# Patient Record
Sex: Female | Born: 1950 | Race: Black or African American | Hispanic: No | Marital: Single
Health system: Southern US, Community
[De-identification: ages and names within clinical notes are randomized; demographics above are authoritative.]

---

## 2004-11-29 ENCOUNTER — Ambulatory Visit (HOSPITAL_COMMUNITY): Admission: RE | Admit: 2004-11-29 | Discharge: 2004-11-29 | Payer: Self-pay | Admitting: Gastroenterology

## 2005-01-31 ENCOUNTER — Ambulatory Visit (HOSPITAL_COMMUNITY): Admission: RE | Admit: 2005-01-31 | Discharge: 2005-01-31 | Payer: Self-pay | Admitting: Internal Medicine

## 2006-03-01 ENCOUNTER — Ambulatory Visit (HOSPITAL_COMMUNITY): Admission: RE | Admit: 2006-03-01 | Discharge: 2006-03-01 | Payer: Self-pay | Admitting: Internal Medicine

## 2007-03-04 ENCOUNTER — Ambulatory Visit (HOSPITAL_COMMUNITY): Admission: RE | Admit: 2007-03-04 | Discharge: 2007-03-04 | Payer: Self-pay | Admitting: Internal Medicine

## 2007-03-05 ENCOUNTER — Observation Stay (HOSPITAL_COMMUNITY): Admission: RE | Admit: 2007-03-05 | Discharge: 2007-03-06 | Payer: Self-pay | Admitting: Otolaryngology

## 2007-03-05 ENCOUNTER — Encounter (INDEPENDENT_AMBULATORY_CARE_PROVIDER_SITE_OTHER): Payer: Self-pay | Admitting: Otolaryngology

## 2007-11-26 ENCOUNTER — Encounter: Admission: RE | Admit: 2007-11-26 | Discharge: 2007-11-26 | Payer: Self-pay | Admitting: Otolaryngology

## 2008-03-04 ENCOUNTER — Ambulatory Visit (HOSPITAL_BASED_OUTPATIENT_CLINIC_OR_DEPARTMENT_OTHER): Admission: RE | Admit: 2008-03-04 | Discharge: 2008-03-04 | Payer: Self-pay | Admitting: Internal Medicine

## 2009-01-15 IMAGING — CR DG CHEST 2V
2 series · 2 of 2 positions shown · non-contrast
Comparison: none

CLINICAL DATA: Deviated nasal septum.  Snoring and sleep apnea.  Preop respiratory exam.  
 CHEST - 2 VIEW:

[view not recorded (1 of 2)]
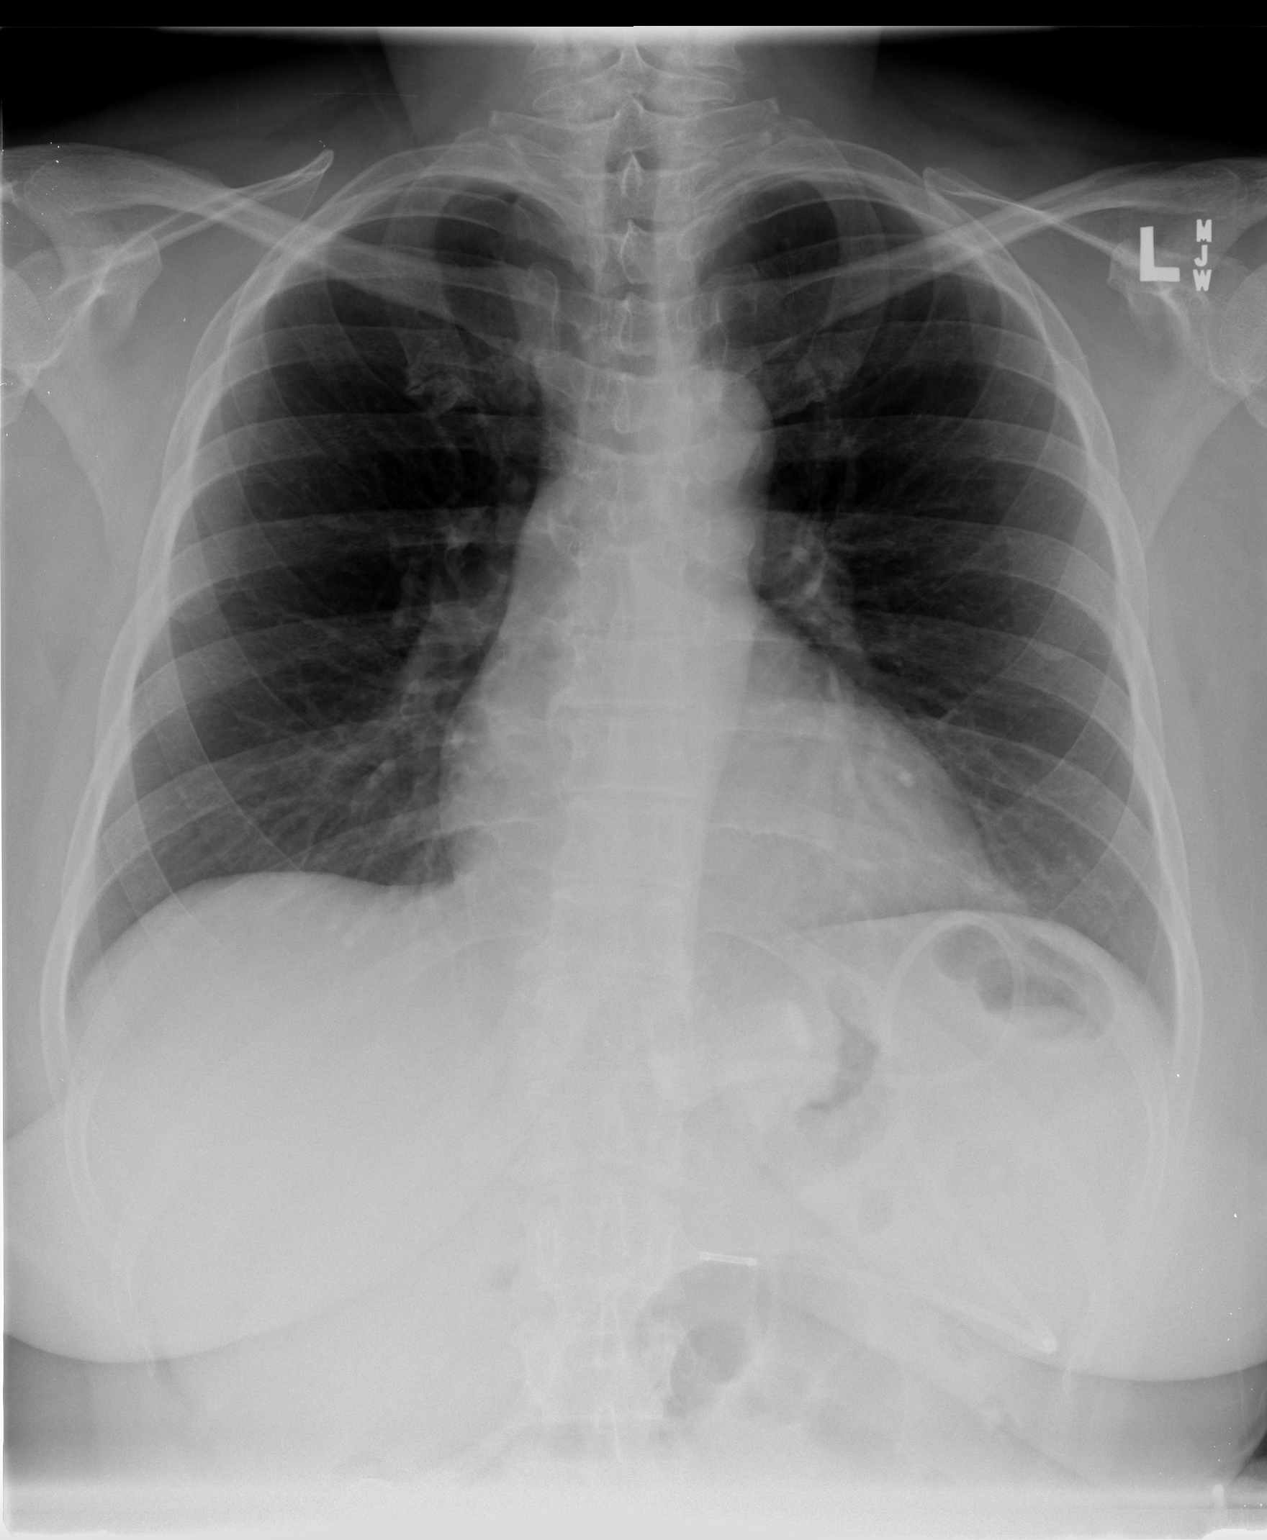

[view not recorded (2 of 2)]
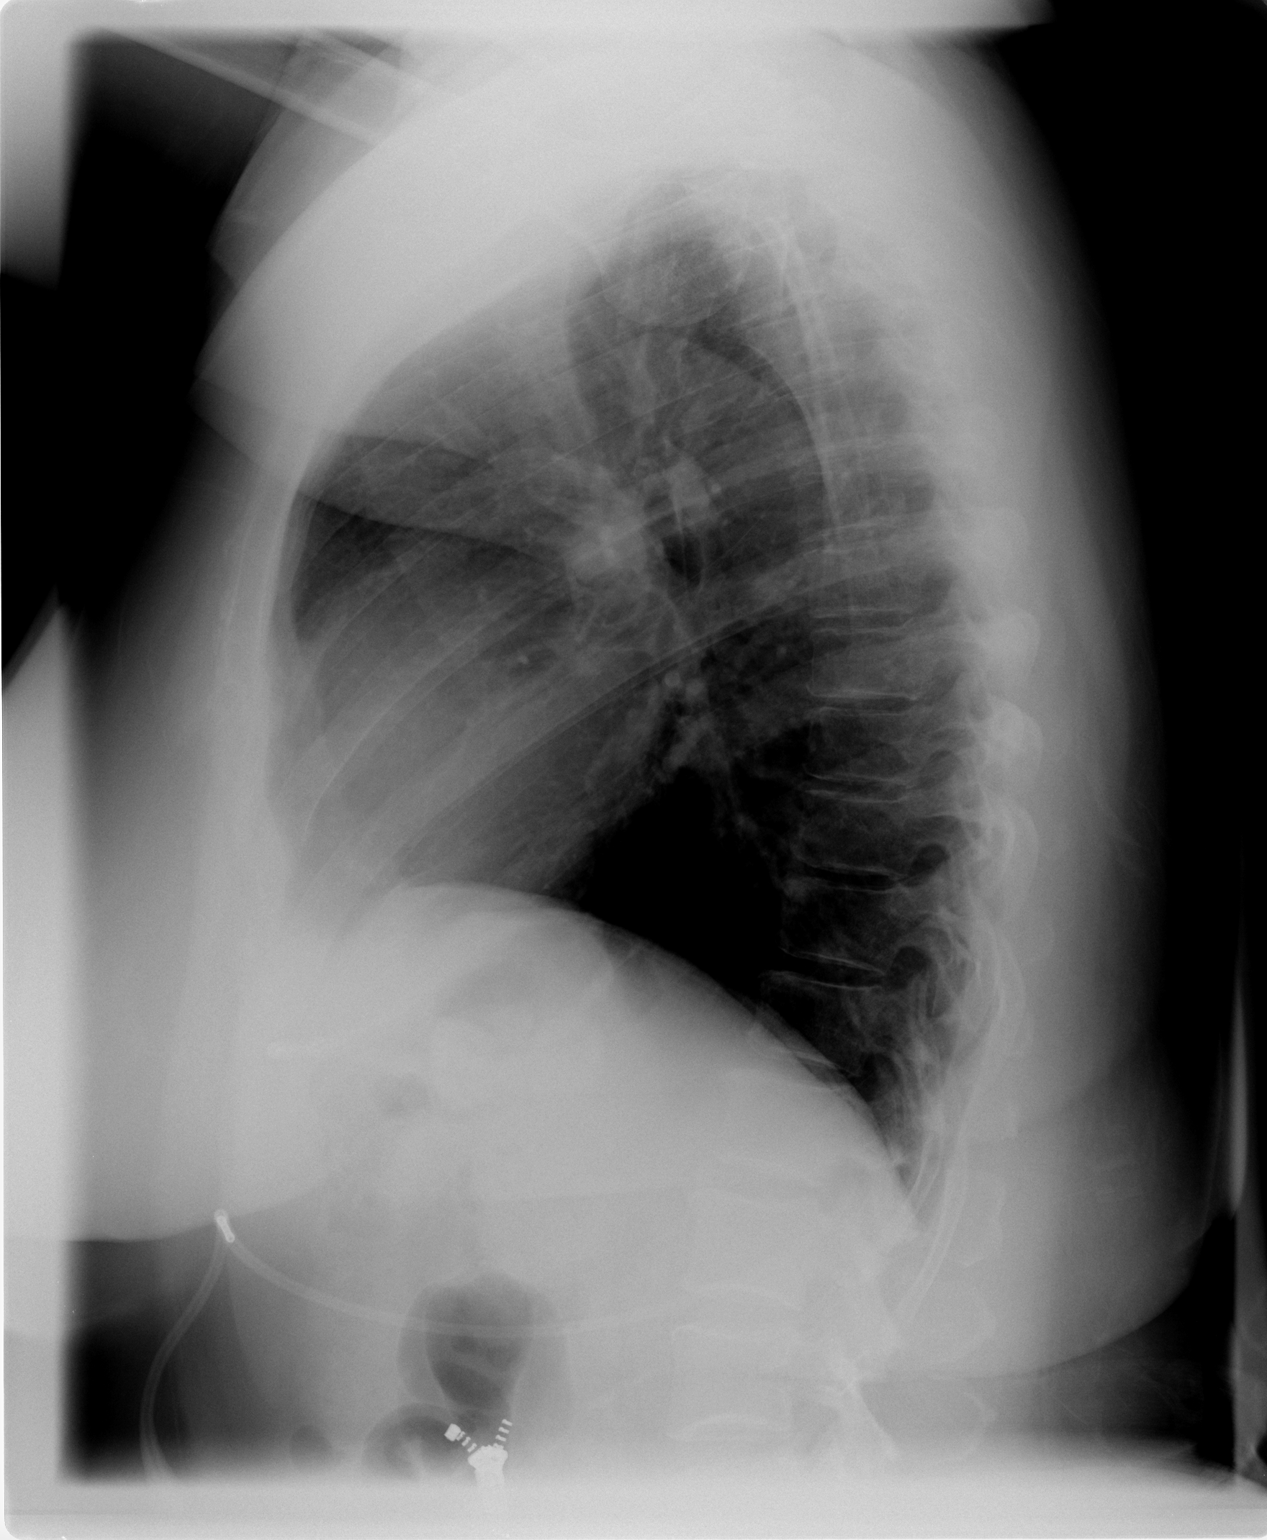

[2 of 2 positions shown; findings below may reference images not displayed]

FINDINGS: The heart size and mediastinal contours are within normal limits.  Both lungs are clear.  The visualized skeletal structures are unremarkable.
IMPRESSION: No active cardiopulmonary disease.

## 2009-03-18 ENCOUNTER — Ambulatory Visit (HOSPITAL_BASED_OUTPATIENT_CLINIC_OR_DEPARTMENT_OTHER): Admission: RE | Admit: 2009-03-18 | Discharge: 2009-03-18 | Payer: Self-pay | Admitting: Internal Medicine

## 2009-03-18 ENCOUNTER — Ambulatory Visit: Payer: Self-pay | Admitting: Diagnostic Radiology

## 2010-03-23 ENCOUNTER — Ambulatory Visit (HOSPITAL_BASED_OUTPATIENT_CLINIC_OR_DEPARTMENT_OTHER)
Admission: RE | Admit: 2010-03-23 | Discharge: 2010-03-23 | Payer: Self-pay | Source: Home / Self Care | Admitting: Internal Medicine

## 2010-08-30 NOTE — H&P (Signed)
NAME:  [name] [name], [name] [name]                  ACCOUNT NO.:  [id number]   MEDICAL RECORD NO.:  [id number]          PATIENT TYPE:  OUT   LOCATION:  [facility]                          FACILITY:  [facility]   PHYSICIAN:  Hermelinda Medicus, M.D.   DATE OF BIRTH:  04-14-1951   DATE OF ADMISSION:  03/04/2007  DATE OF DISCHARGE:                              HISTORY & PHYSICAL   This patient is a 60 year old female who weighs 250 pounds and is 63  inches tall.  She has been on CPAP.  She has a BMI of 44.3, and she also  has an RDI on sleep study of 16.5, lowest O2 is 74%.  She has been on  CPAP but it has failed.  She has gone from 12 to 14 pressure, and this  is because she has quite a severe septal deviation and turbinate  hypertrophy.  She also has a very low uvula and palate and now enters  for a septal reconstruction, turbinate reduction, and also a  uvulopalatoplasty in an effort to gain some space in her oral cavity and  elevate the contour of her palate and remove a very large uvula and also  gain some nasal airway.   ALLERGIES:  PENICILLIN, RASH.   PAST MEDICAL HISTORY:  1. Bilateral knee surgery, menisci removed.  2. Gastric lap band.  3. Maxillary mandibular reduction.  4. Breast reduction.  5. Now her diagnosis of septal deviation, turbinate hypertrophy with      concha bullosa, with redundant uvula and palate.   PHYSICAL EXAMINATION:  VITAL SIGNS:  Blood pressure 128/86, pulse 77,  SAO2 100.  HEENT:  The ears are clear.  Tympanic membrane move well.  She has a low  uvula and palate and a very large uvula.  She also has a turbinate  hypertrophy with concha bullosa with a septal deviation.  CHEST:  Clear.  No rales, rhonchi, or wheezes.  CARDIOVASCULAR:  No __________  murmurs or gallops.  ABDOMEN:  Moderately obese.   Our plan is to get a septal reconstruction and turbinate reduction and a  reduction a concha bullosa with a uvulopalatoplasty under general  endotracheal anesthesia.   FINAL  DIAGNOSES:  1. 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  2. History of bilateral knee menisci surgery.  3. History gastric lap band.  4. History of bilateral maxillary mandibular reduction.  5. History of breast reduction.           ______________________________  Hermelinda Medicus, M.D.     JC/MEDQ  D:  03/05/2007  T:  03/05/2007  Job:  [id number]   cc:   Candyce Churn. Allyne Gee, M.D.

## 2010-08-30 NOTE — Op Note (Signed)
NAME:  [name] [name]                  ACCOUNT NO.:  [id number]   MEDICAL RECORD NO.:  [id number]          PATIENT TYPE:  OBV   LOCATION:  3315                         FACILITY:  [facility]   PHYSICIAN:  Hermelinda Medicus, M.D.   DATE OF BIRTH:  1950-09-20   DATE OF PROCEDURE:  DATE OF DISCHARGE:                               OPERATIVE REPORT   PREOPERATIVE DIAGNOSES:  History of sleep apnea with failed CPAP with  septal deviation, turbinate hypertrophy, concha bullosa, redundant uvula  and palate.   POSTOPERATIVE DIAGNOSES:  History of sleep apnea with failed CPAP with  septal deviation, turbinate hypertrophy, concha bullosa, redundant uvula  and palate.   OPERATION:  Septal reconstruction, turbinate reduction, reduction of  concha bullosa with a uvulopalatoplasty under general endotracheal  anesthesia.   SURGEON:  Dr. Hermelinda Medicus.   ANESTHESIA:  General endotracheal with Dr. Randa Evens. The patient is aware  of the risks and gains that she is going to have nasal congestion, that  she will have airway congestion and a crusting and that she will be in  the office to have this cleansed. Her oral cavity is also going to be  quite sore. She is going to avoid anything containing vinegar or highly  flavored foods, pizzas or tacos or hamburger or anything acidic. Also  she is not to travel a long distance for about 10 days.   DESCRIPTION OF PROCEDURE:  The patient placed in the supine position and  under general endotracheal anesthesia, the nose was first approached  after prepping and draping. Cocaine 200 mg, 1% Xylocaine 7 mL was used  and the septum was first approached after reducing the inferior  turbinates but not removing the mucous membrane we outfractured these  turbinates. Also a very large concha bullosa was seen on the right side  which was corrected by again crushing the concha bullosa but not  removing mucous membrane. The septum was then approached using a  hemitransfixion  incision, carried back along the posterior quadrilateral  cartilage and ethmoid septum. The open and close Jansen-Middleton's were  used to remove the septal ethmoid bone as well as the vomerine bony  structure and then the remainder of the septum was able to be pushed  back in the midline and held in place. Once this was completed, closure  was begun using 4-0 plain catgut through-and-through septal suture as a  bunching stitch, maintaining any hemostasis and also minimizing swelling  using the 4-0 plain x2.  Once this was completed then we could get  excellent airway by reducing the turbinates and we further used the  __________ at 12 to minimize the mucous membrane edema cauterizing the  inferior lateral aspect of these turbinates. Once this was completed,  then the attention was then carried to the oral cavity where the oral  cavity was approached placing the tonsillar gag and the uvula was  approximately three times its normal size. We made incisions lateral to  this using the electrocoagulation for hemostasis and we also trimmed the  uvula down to about its more normal size. We closed this  mucous membrane  anterior-posterior with 5-0 plain catgut and all hemostasis being  established. The stomach was suctioned, the nasopharynx was suctioned,  Telfa was placed within the nose temporarily and this was removed and  then anesthesia trumpets were placed and these will be placed for her  airway postoperatively as she is awakening.  She will be placed in the step-down just for observation with pulse  oximeter control and then our plan would be hopefully to be able to  abandon the CPAP in the future.  She will follow up in 3 days and then  in 10 days and then 3 weeks, 6 weeks, 3 months, 6 months and a year.           ______________________________  Hermelinda Medicus, M.D.     JC/MEDQ  D:  03/05/2007  T:  03/05/2007  Job:  [id number]

## 2011-01-24 LAB — POCT I-STAT 4, (NA,K, GLUC, HGB,HCT)
Glucose, Bld: 99
HCT: 45
Hemoglobin: 15.3 — ABNORMAL HIGH
Operator id: 151361
Potassium: 4.6
Sodium: 138

## 2011-01-24 LAB — CBC
HCT: 36.3
Hemoglobin: 12.2
MCHC: 33.6
MCV: 86.8
Platelets: 299
RBC: 4.18
RDW: 14.5
WBC: 6

## 2011-01-24 LAB — URINALYSIS, ROUTINE W REFLEX MICROSCOPIC
Bilirubin Urine: NEGATIVE
Glucose, UA: NEGATIVE
Hgb urine dipstick: NEGATIVE
Ketones, ur: NEGATIVE
Nitrite: NEGATIVE
Protein, ur: NEGATIVE
Specific Gravity, Urine: 1.02
Urobilinogen, UA: 0.2
pH: 7

## 2011-01-24 LAB — I-STAT 8, (EC8 V) (CONVERTED LAB)
Acid-base deficit: 1
BUN: 13
Bicarbonate: 22.3
Chloride: 112
Glucose, Bld: 97
HCT: 45
Hemoglobin: 15.3 — ABNORMAL HIGH
Operator id: 151361
Potassium: 6.7
Sodium: 141
TCO2: 23
pCO2, Ven: 34.1 — ABNORMAL LOW
pH, Ven: 7.423 — ABNORMAL HIGH

## 2011-01-24 LAB — BASIC METABOLIC PANEL
BUN: 8
CO2: 28
Calcium: 9.7
Chloride: 103
Creatinine, Ser: 0.68
GFR calc Af Amer: >60
GFR calc non Af Amer: >60
Glucose, Bld: 88
Potassium: 3.7
Sodium: 138

## 2011-03-16 ENCOUNTER — Other Ambulatory Visit (HOSPITAL_BASED_OUTPATIENT_CLINIC_OR_DEPARTMENT_OTHER): Payer: Self-pay | Admitting: Internal Medicine

## 2011-03-16 DIAGNOSIS — Z1231 Encounter for screening mammogram for malignant neoplasm of breast: Secondary | ICD-10-CM

## 2011-03-29 ENCOUNTER — Ambulatory Visit (HOSPITAL_BASED_OUTPATIENT_CLINIC_OR_DEPARTMENT_OTHER)
Admission: RE | Admit: 2011-03-29 | Discharge: 2011-03-29 | Disposition: A | Discharge status: Home / Self Care | Payer: Federal, State, Local not specified - PPO | Source: Ambulatory Visit | Attending: Internal Medicine | Admitting: Internal Medicine

## 2011-03-29 DIAGNOSIS — Z1231 Encounter for screening mammogram for malignant neoplasm of breast: Secondary | ICD-10-CM

## 2012-03-04 ENCOUNTER — Other Ambulatory Visit (HOSPITAL_BASED_OUTPATIENT_CLINIC_OR_DEPARTMENT_OTHER): Payer: Self-pay | Admitting: Internal Medicine

## 2012-03-04 DIAGNOSIS — Z1231 Encounter for screening mammogram for malignant neoplasm of breast: Secondary | ICD-10-CM

## 2012-04-02 ENCOUNTER — Ambulatory Visit (HOSPITAL_BASED_OUTPATIENT_CLINIC_OR_DEPARTMENT_OTHER)
Admission: RE | Admit: 2012-04-02 | Discharge: 2012-04-02 | Disposition: A | Discharge status: Home / Self Care | Payer: Federal, State, Local not specified - PPO | Source: Ambulatory Visit | Attending: Internal Medicine | Admitting: Internal Medicine

## 2012-04-02 DIAGNOSIS — Z1231 Encounter for screening mammogram for malignant neoplasm of breast: Secondary | ICD-10-CM | POA: Insufficient documentation

## 2012-04-02 DIAGNOSIS — N6459 Other signs and symptoms in breast: Secondary | ICD-10-CM | POA: Insufficient documentation

## 2012-04-16 ENCOUNTER — Other Ambulatory Visit: Payer: Self-pay | Admitting: Internal Medicine

## 2012-04-16 DIAGNOSIS — R928 Other abnormal and inconclusive findings on diagnostic imaging of breast: Secondary | ICD-10-CM

## 2012-05-01 ENCOUNTER — Ambulatory Visit
Admission: RE | Admit: 2012-05-01 | Discharge: 2012-05-01 | Disposition: A | Discharge status: Home / Self Care | Payer: Federal, State, Local not specified - PPO | Source: Ambulatory Visit | Attending: Internal Medicine | Admitting: Internal Medicine

## 2012-05-01 ENCOUNTER — Other Ambulatory Visit: Payer: Self-pay | Admitting: Internal Medicine

## 2012-05-01 DIAGNOSIS — R928 Other abnormal and inconclusive findings on diagnostic imaging of breast: Secondary | ICD-10-CM

## 2012-05-13 ENCOUNTER — Ambulatory Visit
Admission: RE | Admit: 2012-05-13 | Discharge: 2012-05-13 | Disposition: A | Discharge status: Home / Self Care | Payer: Federal, State, Local not specified - PPO | Source: Ambulatory Visit | Attending: Internal Medicine | Admitting: Internal Medicine

## 2012-05-13 ENCOUNTER — Other Ambulatory Visit: Payer: Self-pay | Admitting: Internal Medicine

## 2012-05-13 DIAGNOSIS — R928 Other abnormal and inconclusive findings on diagnostic imaging of breast: Secondary | ICD-10-CM

## 2013-03-03 ENCOUNTER — Other Ambulatory Visit (HOSPITAL_BASED_OUTPATIENT_CLINIC_OR_DEPARTMENT_OTHER): Payer: Self-pay | Admitting: Internal Medicine

## 2013-03-03 DIAGNOSIS — Z1231 Encounter for screening mammogram for malignant neoplasm of breast: Secondary | ICD-10-CM

## 2013-04-02 ENCOUNTER — Ambulatory Visit (HOSPITAL_BASED_OUTPATIENT_CLINIC_OR_DEPARTMENT_OTHER)
Admission: RE | Admit: 2013-04-02 | Discharge: 2013-04-02 | Disposition: A | Discharge status: Home / Self Care | Payer: Federal, State, Local not specified - PPO | Source: Ambulatory Visit | Attending: Internal Medicine | Admitting: Internal Medicine

## 2013-04-02 DIAGNOSIS — Z1231 Encounter for screening mammogram for malignant neoplasm of breast: Secondary | ICD-10-CM | POA: Insufficient documentation

## 2013-04-16 ENCOUNTER — Other Ambulatory Visit: Payer: Self-pay | Admitting: Internal Medicine

## 2013-04-16 DIAGNOSIS — R109 Unspecified abdominal pain: Secondary | ICD-10-CM

## 2013-04-16 DIAGNOSIS — R102 Pelvic and perineal pain: Secondary | ICD-10-CM

## 2013-04-23 ENCOUNTER — Ambulatory Visit
Admission: RE | Admit: 2013-04-23 | Discharge: 2013-04-23 | Disposition: A | Discharge status: Home / Self Care | Payer: Federal, State, Local not specified - PPO | Source: Ambulatory Visit | Attending: Internal Medicine | Admitting: Internal Medicine

## 2013-04-23 DIAGNOSIS — R102 Pelvic and perineal pain: Secondary | ICD-10-CM

## 2013-04-23 DIAGNOSIS — R109 Unspecified abdominal pain: Secondary | ICD-10-CM

## 2013-04-23 MED ORDER — IOHEXOL 300 MG/ML  SOLN
125.0000 mL | Freq: Once | INTRAMUSCULAR | Status: AC | PRN
  Administered 2013-04-23: 125 mL via INTRAVENOUS

## 2014-03-18 IMAGING — US US BREAST*R*
1 series · 6 of 6 positions shown · non-contrast
Comparison: 03/29/2011, 03/23/2010, 03/18/2009, 03/04/2008 and
03/04/2007

CLINICAL DATA: Patient presents for additional views of the right
breast as follow-up to a recent screening exam suggesting a
possible mass of the deep third of the lower right breast. History
of prior reduction mammoplasty 0999.  History of MGUS.

DIGITAL DIAGNOSTIC RIGHT MAMMOGRAM  AND RIGHT BREAST ULTRASOUND:

[Series 1: us breast*right* · 6 of 6 slices shown]
[im 1/6]
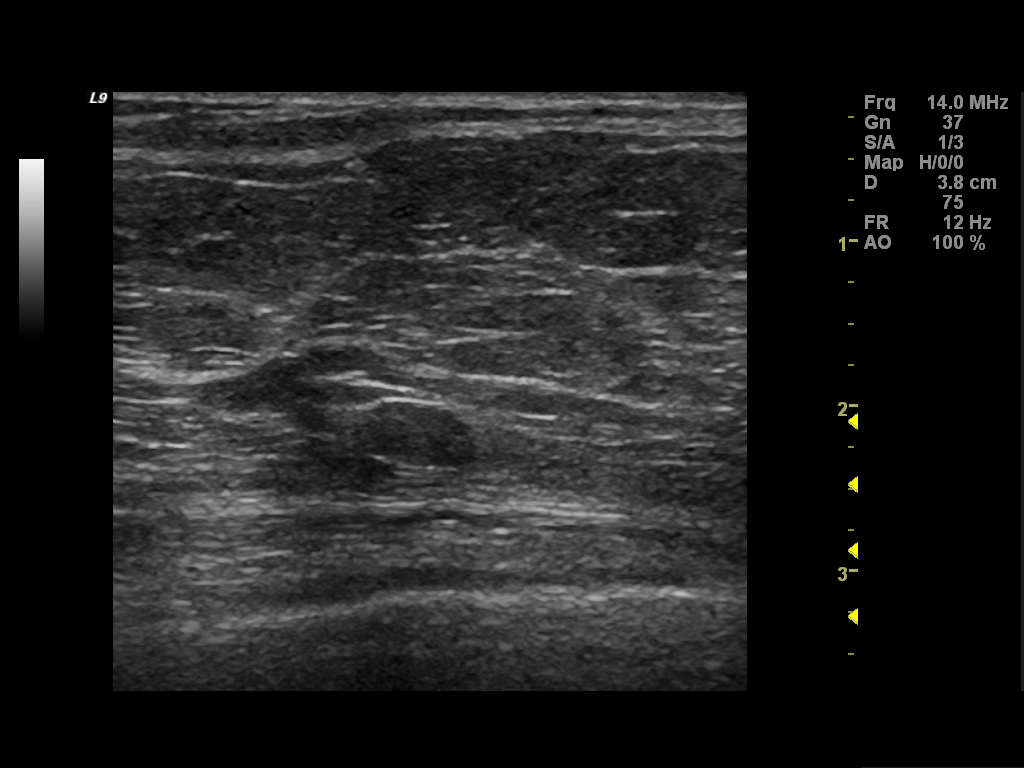
[im 2/6]
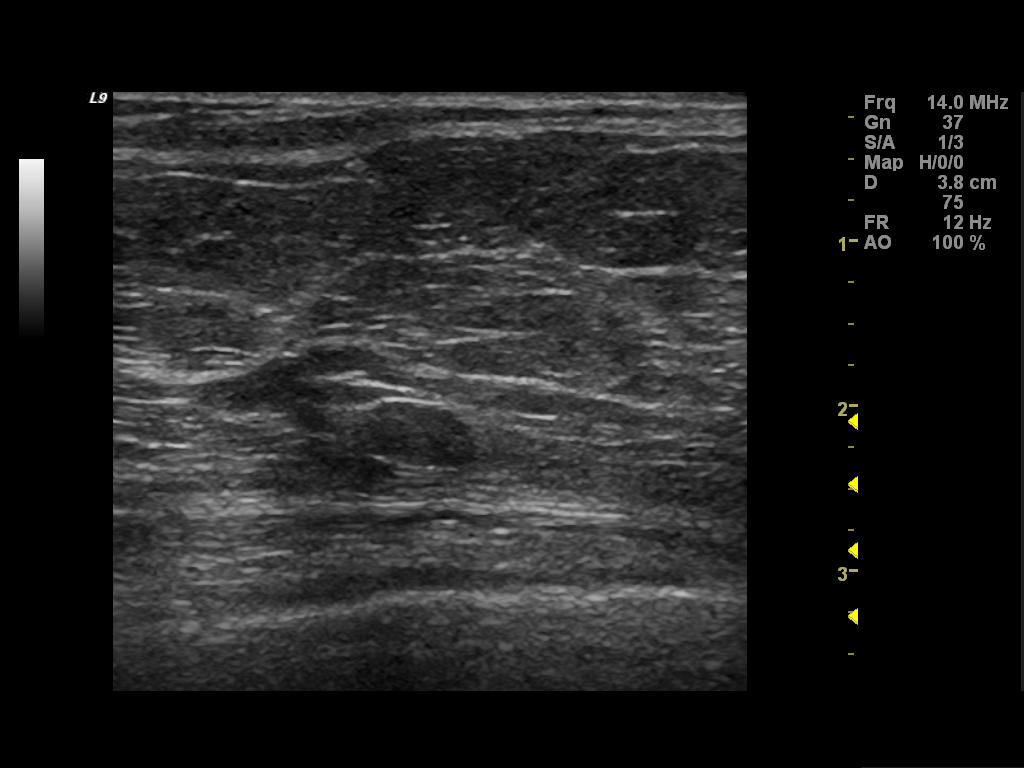
[im 3/6]
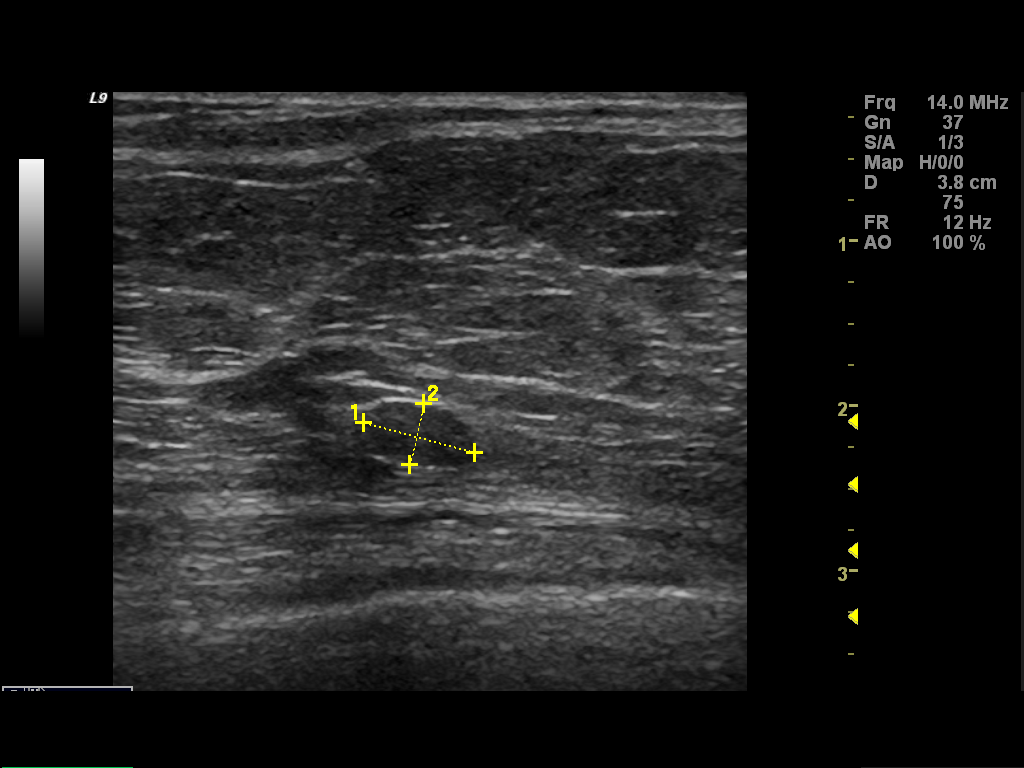
[im 4/6]
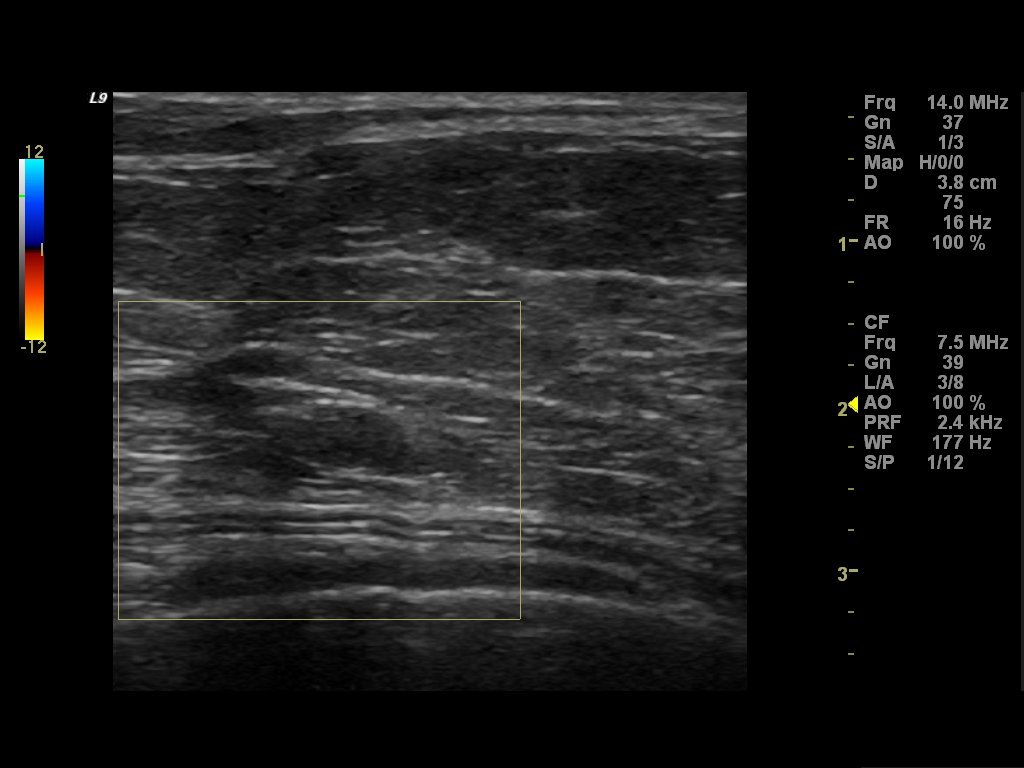
[im 5/6]
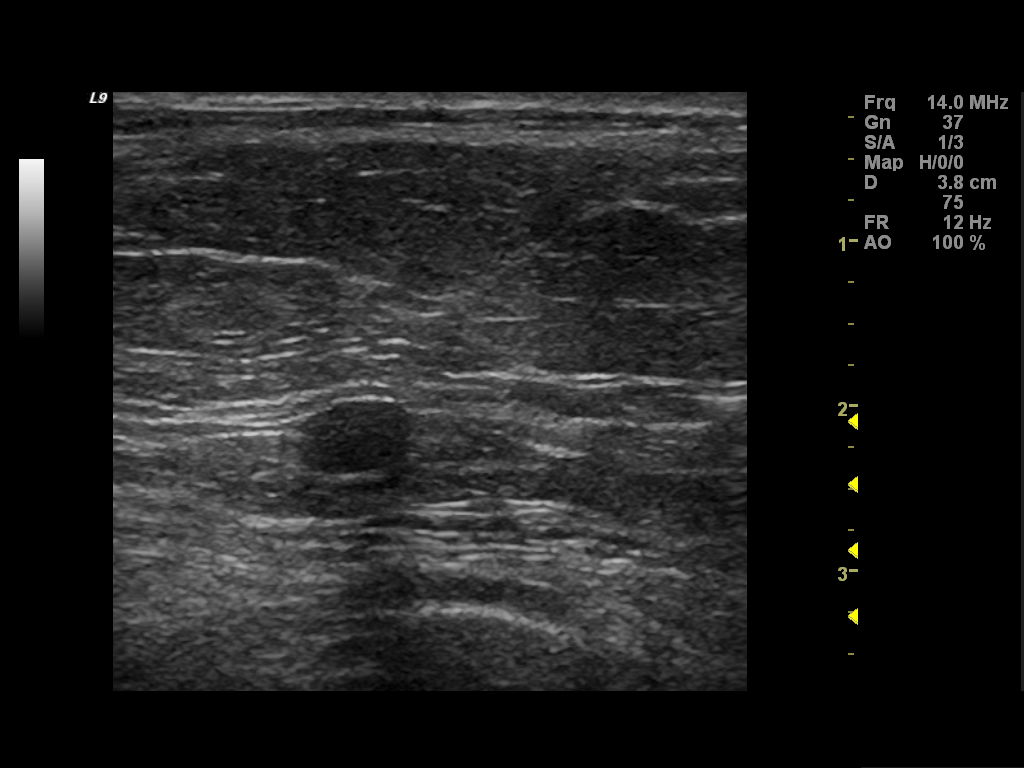
[im 6/6]
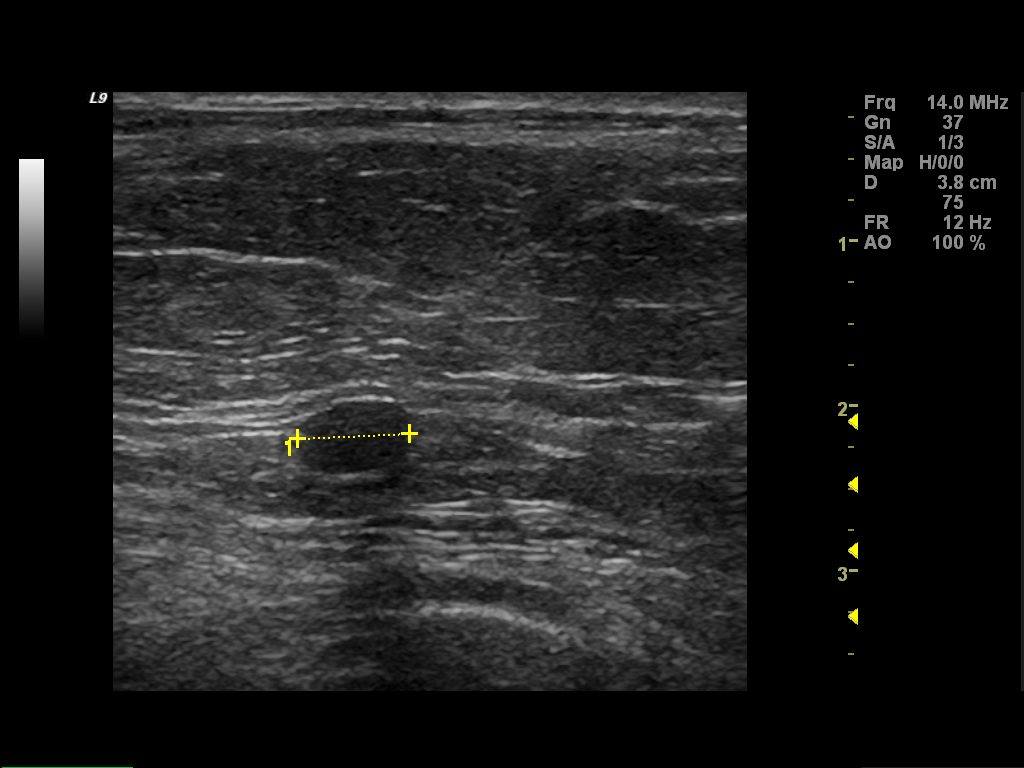

[6 of 6 positions shown; findings below may reference images not displayed]

FINDINGS: ACR Breast Density Category 1: The breast tissue is almost entirely
fatty.

Additional exaggerated CC, spot compression MLO and true lateral
images were obtained.  Again noted is a rounded circumscribed mass
of the deep third of the lower slightly outer right breast.

Ultrasound is performed, showing a well defined ovoid homogeneously
hypoechoic mass at the 7 o'clock position of the right breast 6 cm
from the nipple.  This measures 4 x 7 x 7 mm as features suggest a
benign process.
IMPRESSION: Probable benign well defined ovoid homogeneously hypoechoic mass at
the 7 o'clock position of the right breast 6 cm from the nipple
measuring 4 x 7 x 7 mm.

RECOMMENDATION:
Management options of 6-month follow-up imaging surveillance versus
ultrasound-guided core needle biopsy were discussed as patient
wishes to proceed with ultrasound guided core needle biopsy.

I have discussed the findings and recommendations with the patient.
Results were also provided in writing at the conclusion of the
visit.

BI-RADS CATEGORY 4:  Suspicious abnormality - biopsy should be
considered.

Biopsy to be scheduled prior to patient's departure from the [REDACTED].  Biopsy scheduled for 05/13/2012 at [DATE] a.m..

## 2015-03-10 IMAGING — CT CT ABD-PELV W/ CM
1 of 3 series · 14 of 32 positions shown, 19 images · IV contrast (READICAT/WATER & [ID] OMNI 350)
Comparison: None.

CLINICAL DATA: Left pelvic pain, prior lap band (removed November 2012)

EXAM:
CT ABDOMEN AND PELVIS WITH CONTRAST
TECHNIQUE: Multidetector CT imaging of the abdomen and pelvis was performed
using the standard protocol following bolus administration of
intravenous contrast.
CONTRAST:  125mL OMNIPAQUE IOHEXOL 300 MG/ML  SOLN

[Series 3: abd/pelvis with · axial · 0.98mm/px · z∈[-449,-29]mm · 14 of 94 slices shown, 19 images]
[im 5/94  soft-tissue]
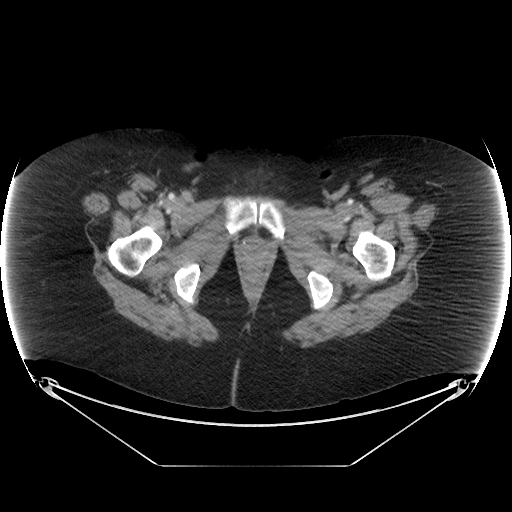
[im 5/94  bone]
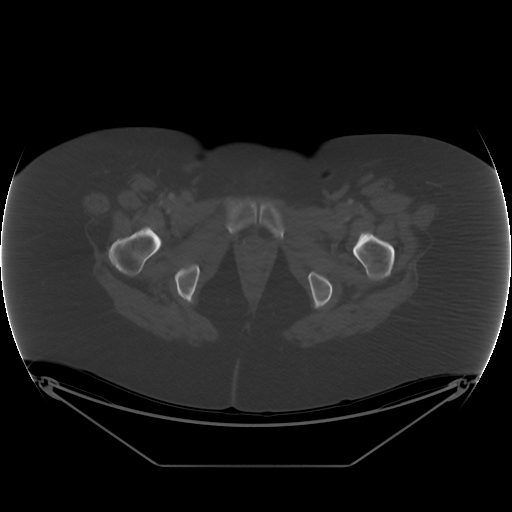
[im 14/94  soft-tissue]
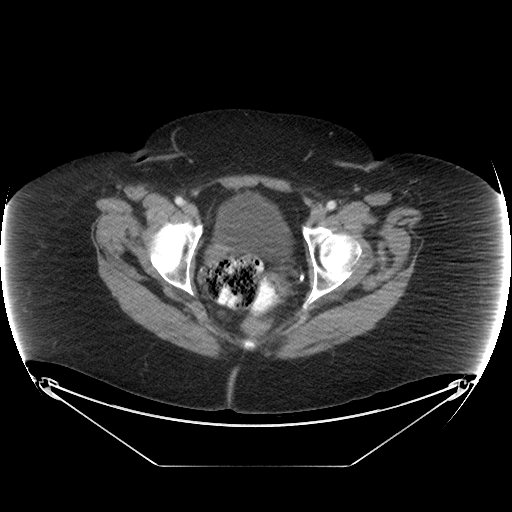
[im 19/94  soft-tissue]
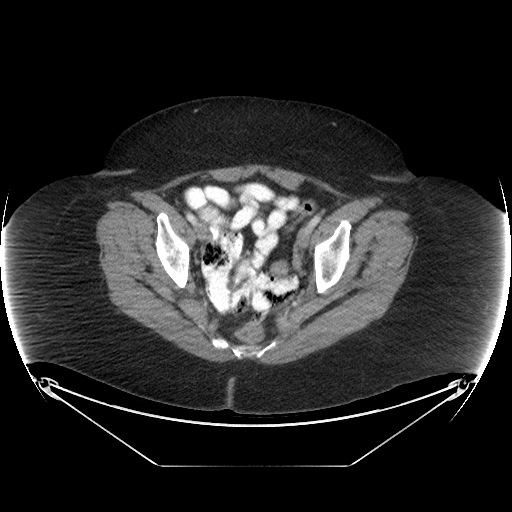
[im 28/94  soft-tissue]
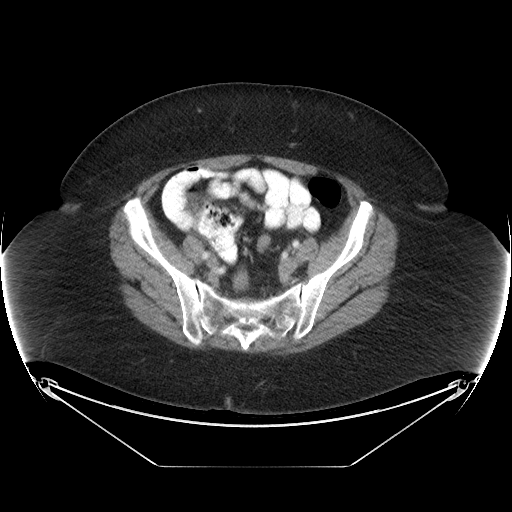
[im 33/94  soft-tissue]
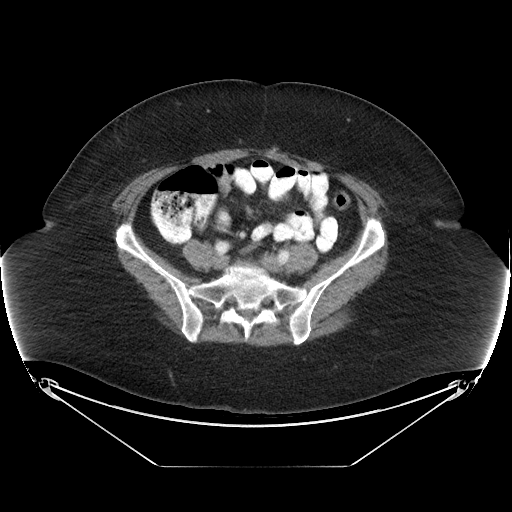
[im 42/94  soft-tissue]
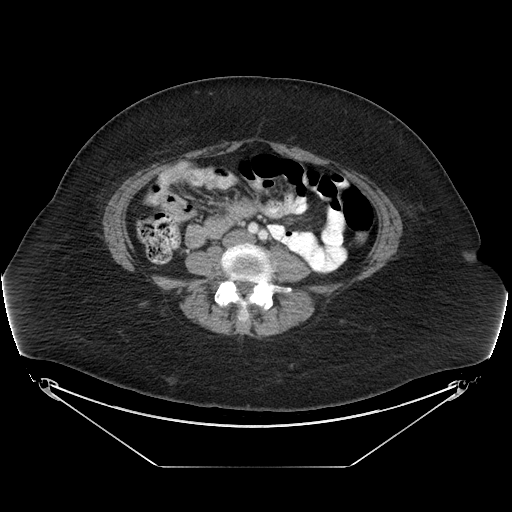
[im 47/94  soft-tissue]
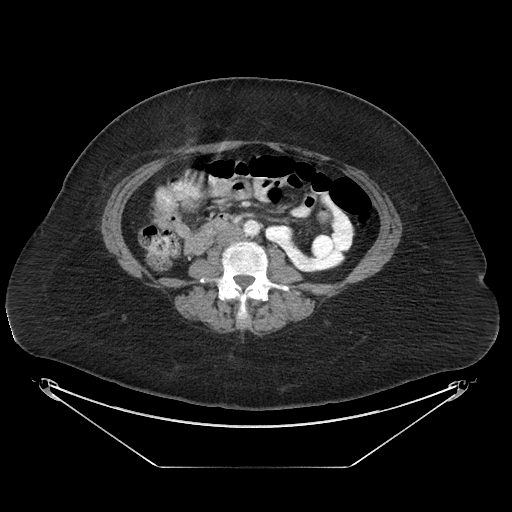
[im 52/94  soft-tissue]
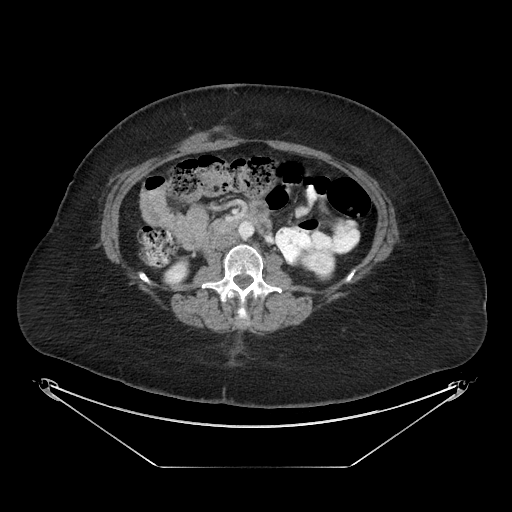
[im 61/94  soft-tissue]
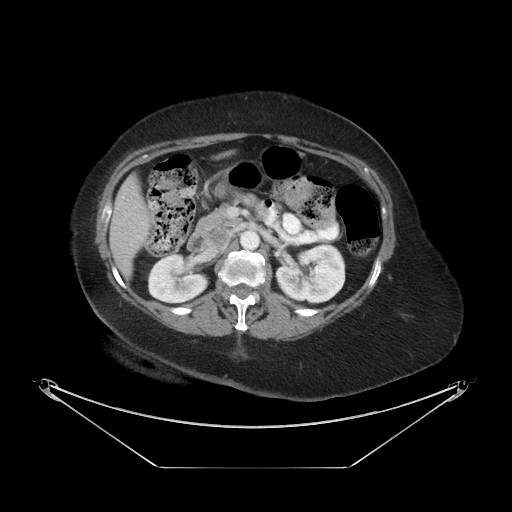
[im 61/94  bone]
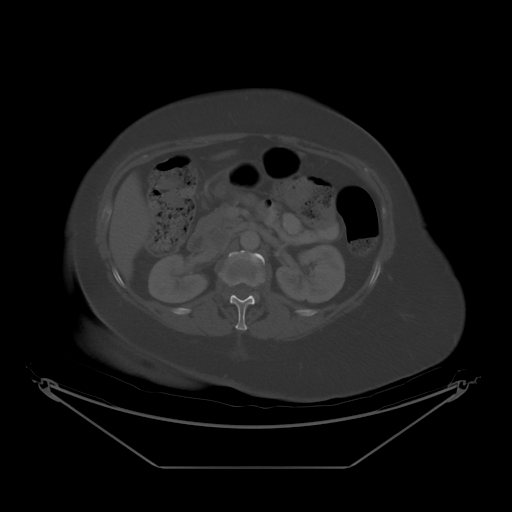
[im 66/94  soft-tissue]
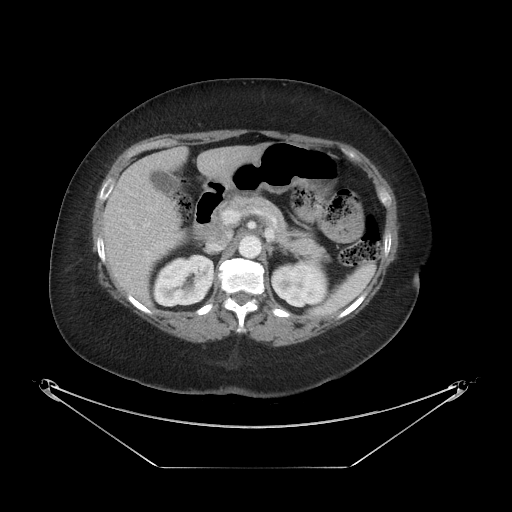
[im 75/94  soft-tissue]
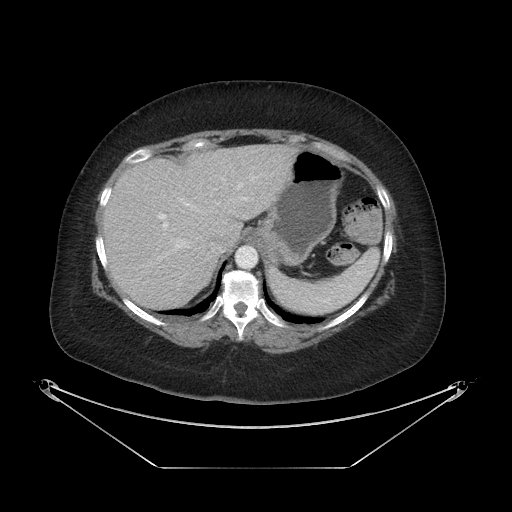
[im 75/94  lung]
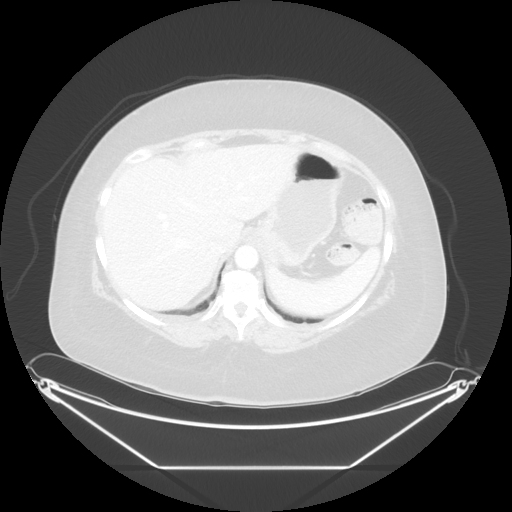
[im 80/94  soft-tissue]
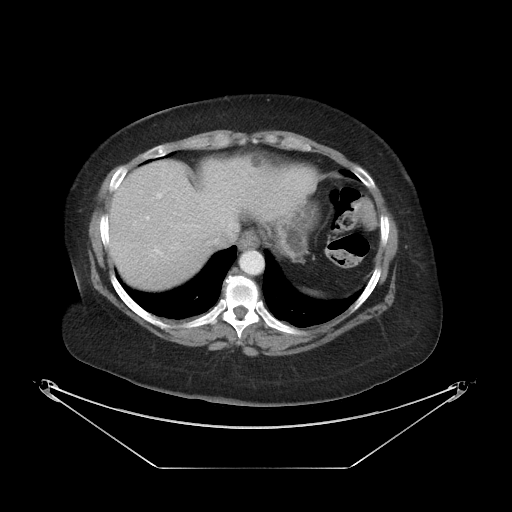
[im 80/94  lung]
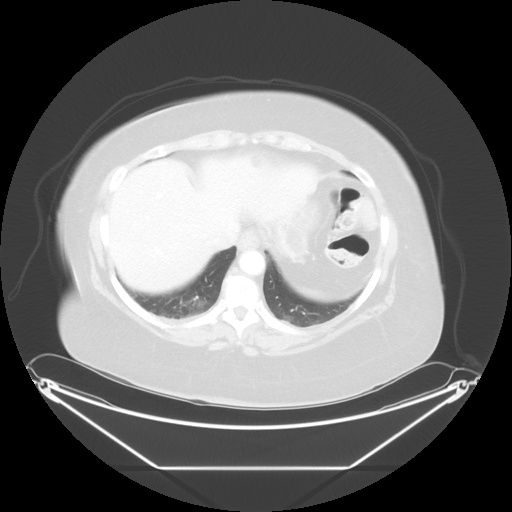
[im 84/94  lung]
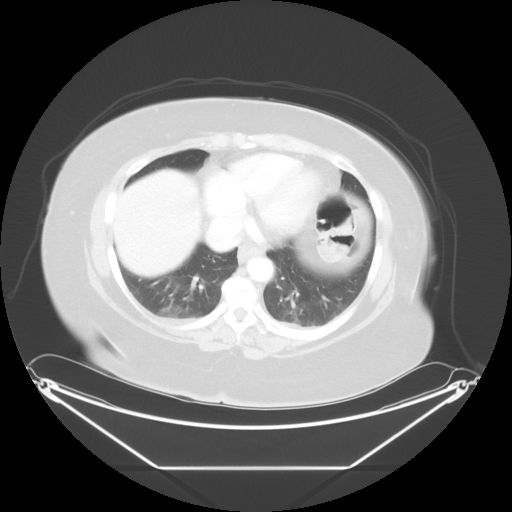
[im 89/94  soft-tissue]
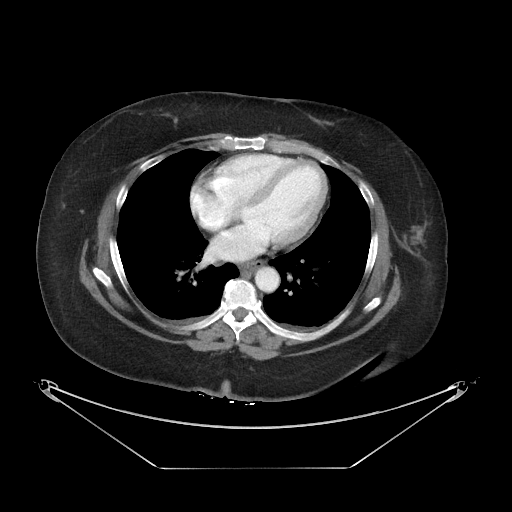
[im 89/94  lung]
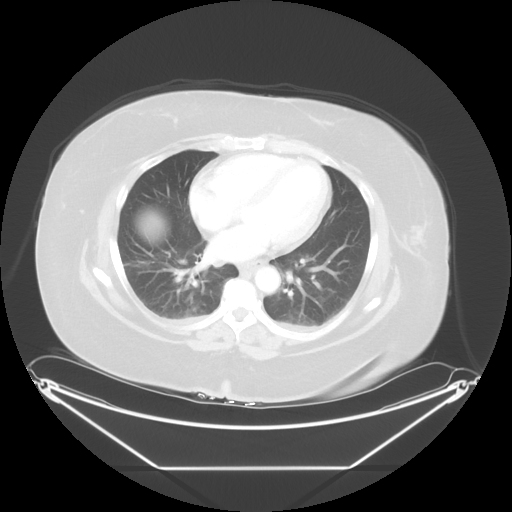

[14 of 32 positions shown; findings below may reference images not displayed]

FINDINGS: Lung bases are essentially clear.

Cysts in the lateral segment left hepatic lobe measuring up to
cm (series 3/image 22).

Spleen, pancreas, and adrenal glands are within normal limits.

Gallbladder is unremarkable. No intrahepatic or extrahepatic ductal
dilatation.

2.8 cm lateral interpolar right renal cyst (series 3/image 37). Left
kidney is within normal limits. No hydronephrosis.

No evidence of bowel obstruction.  Normal appendix.

No evidence of abdominal aortic aneurysm.

No abdominopelvic ascites.

No suspicious abdominopelvic lymphadenopathy.

Status post hysterectomy. Left ovary is within normal limits. No
right adnexal mass.

Bladder is within normal limits.

Calcified pelvic phleboliths.

Degenerative changes of the visualized thoracolumbar spine.
IMPRESSION: No evidence of bowel obstruction.  Normal appendix.

No CT findings to account for the patient's left pelvic pain.
